# Patient Record
Sex: Female | Born: 1970 | Race: Black or African American | Hispanic: No | Marital: Married | Smoking: Never smoker
Health system: Southern US, Community
[De-identification: ages and names within clinical notes are randomized; demographics above are authoritative.]

---

## 1999-07-30 ENCOUNTER — Other Ambulatory Visit: Admission: RE | Admit: 1999-07-30 | Discharge: 1999-07-30 | Payer: Self-pay | Admitting: Obstetrics

## 2000-03-03 ENCOUNTER — Inpatient Hospital Stay (HOSPITAL_COMMUNITY): Admission: AD | Admit: 2000-03-03 | Discharge: 2000-03-06 | Payer: Self-pay

## 2006-08-22 ENCOUNTER — Ambulatory Visit (HOSPITAL_COMMUNITY): Admission: RE | Admit: 2006-08-22 | Discharge: 2006-08-22 | Payer: Self-pay | Admitting: Family Medicine

## 2006-09-08 ENCOUNTER — Ambulatory Visit (HOSPITAL_COMMUNITY): Admission: RE | Admit: 2006-09-08 | Discharge: 2006-09-08 | Payer: Self-pay | Admitting: Family Medicine

## 2006-09-12 ENCOUNTER — Ambulatory Visit (HOSPITAL_COMMUNITY): Admission: RE | Admit: 2006-09-12 | Discharge: 2006-09-12 | Payer: Self-pay | Admitting: Family Medicine

## 2010-02-13 ENCOUNTER — Ambulatory Visit (HOSPITAL_COMMUNITY)
Admission: RE | Admit: 2010-02-13 | Discharge: 2010-02-13 | Payer: Self-pay | Source: Home / Self Care | Admitting: Neurology

## 2010-04-07 ENCOUNTER — Ambulatory Visit (HOSPITAL_COMMUNITY)
Admission: RE | Admit: 2010-04-07 | Discharge: 2010-04-07 | Payer: Self-pay | Source: Home / Self Care | Attending: Family Medicine | Admitting: Family Medicine

## 2010-04-13 ENCOUNTER — Other Ambulatory Visit (HOSPITAL_COMMUNITY): Payer: Self-pay | Admitting: Family Medicine

## 2010-04-13 DIAGNOSIS — R935 Abnormal findings on diagnostic imaging of other abdominal regions, including retroperitoneum: Secondary | ICD-10-CM

## 2010-04-13 DIAGNOSIS — N83201 Unspecified ovarian cyst, right side: Secondary | ICD-10-CM

## 2010-05-19 ENCOUNTER — Ambulatory Visit (HOSPITAL_COMMUNITY): Payer: 59

## 2010-05-26 ENCOUNTER — Ambulatory Visit (HOSPITAL_COMMUNITY): Admission: RE | Admit: 2010-05-26 | Payer: 59 | Source: Ambulatory Visit

## 2010-12-07 ENCOUNTER — Other Ambulatory Visit: Payer: Self-pay | Admitting: Family Medicine

## 2010-12-07 DIAGNOSIS — Z139 Encounter for screening, unspecified: Secondary | ICD-10-CM

## 2010-12-11 ENCOUNTER — Ambulatory Visit (HOSPITAL_COMMUNITY): Payer: 59

## 2011-02-17 ENCOUNTER — Encounter: Payer: Self-pay | Admitting: Gastroenterology

## 2011-02-25 ENCOUNTER — Telehealth: Payer: Self-pay | Admitting: Gastroenterology

## 2011-02-25 ENCOUNTER — Ambulatory Visit: Payer: 59 | Admitting: Gastroenterology

## 2011-02-25 NOTE — Telephone Encounter (Signed)
Pt was a no show

## 2011-03-02 NOTE — Telephone Encounter (Signed)
Please inform PCP pt was a no-show for new appt.

## 2011-03-17 ENCOUNTER — Encounter: Payer: Self-pay | Admitting: Gastroenterology

## 2011-03-17 NOTE — Telephone Encounter (Signed)
Mailed letter to patient to call to [facility]

## 2011-06-01 ENCOUNTER — Ambulatory Visit (HOSPITAL_COMMUNITY)
Admission: RE | Admit: 2011-06-01 | Discharge: 2011-06-01 | Disposition: A | Discharge status: Home / Self Care | Payer: BC Managed Care – PPO | Source: Ambulatory Visit | Attending: Family Medicine | Admitting: Family Medicine

## 2011-06-01 DIAGNOSIS — R928 Other abnormal and inconclusive findings on diagnostic imaging of breast: Secondary | ICD-10-CM | POA: Insufficient documentation

## 2011-06-01 DIAGNOSIS — Z1231 Encounter for screening mammogram for malignant neoplasm of breast: Secondary | ICD-10-CM | POA: Insufficient documentation

## 2011-06-01 DIAGNOSIS — Z139 Encounter for screening, unspecified: Secondary | ICD-10-CM

## 2011-06-08 ENCOUNTER — Other Ambulatory Visit: Payer: Self-pay | Admitting: Family Medicine

## 2011-06-08 DIAGNOSIS — R928 Other abnormal and inconclusive findings on diagnostic imaging of breast: Secondary | ICD-10-CM

## 2011-06-14 IMAGING — US US PELVIS COMPLETE MODIFY
1 series · 13 of 25 positions shown · non-contrast
Comparison: None

CLINICAL DATA: Pelvic pain



[Series 1: us pelvis complete modify · 0.26mm/px · 13 of 79 slices shown]
[im 1/79]
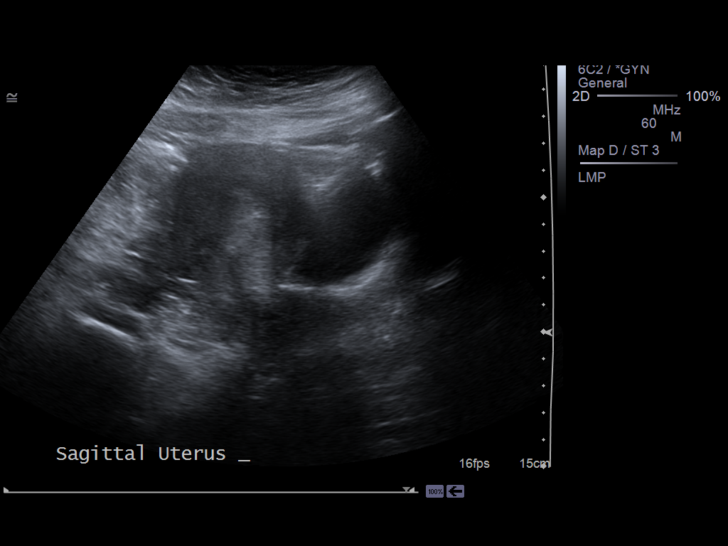
[im 7/79]
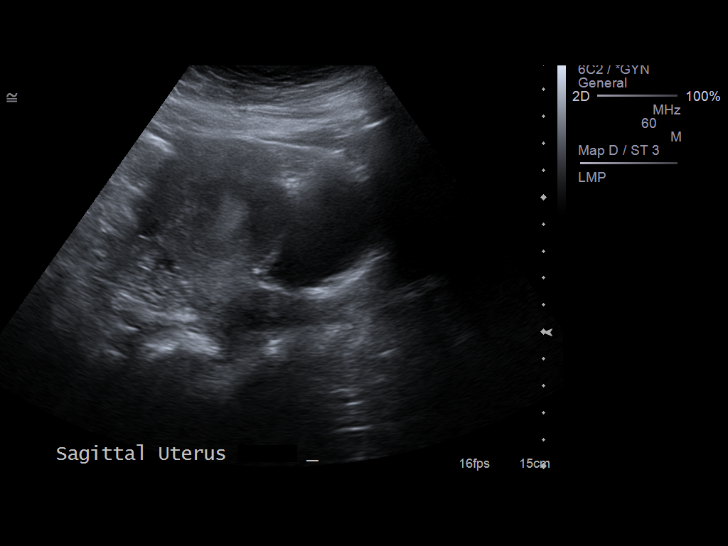
[im 14/79]
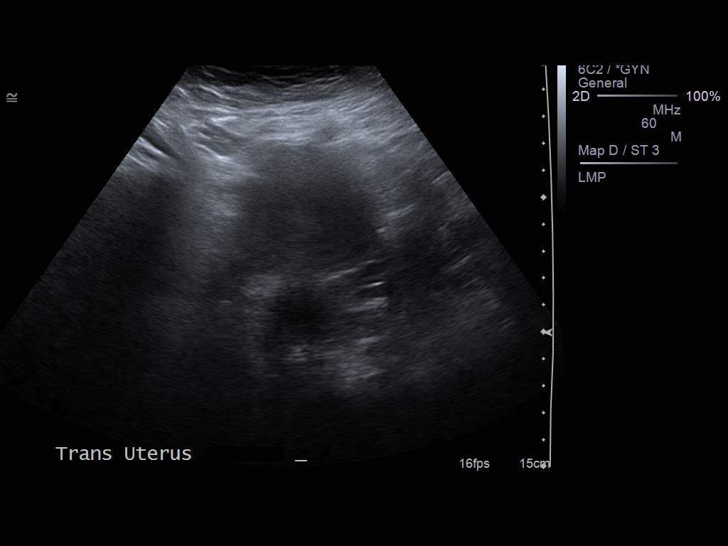
[im 20/79]
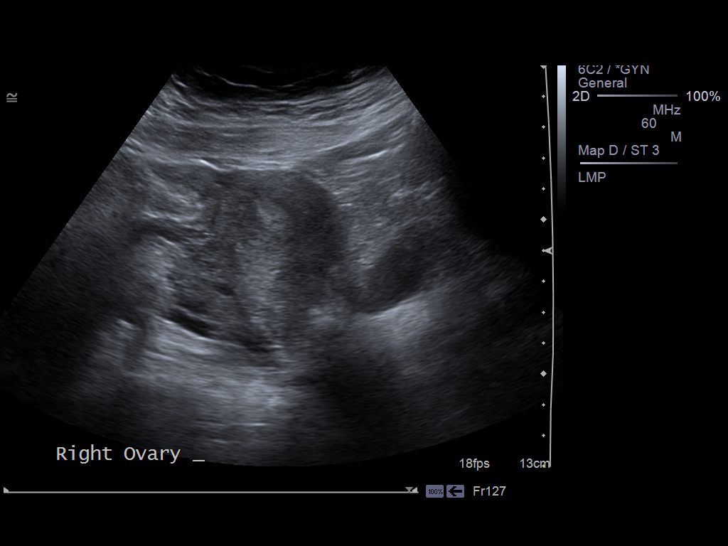
[im 27/79]
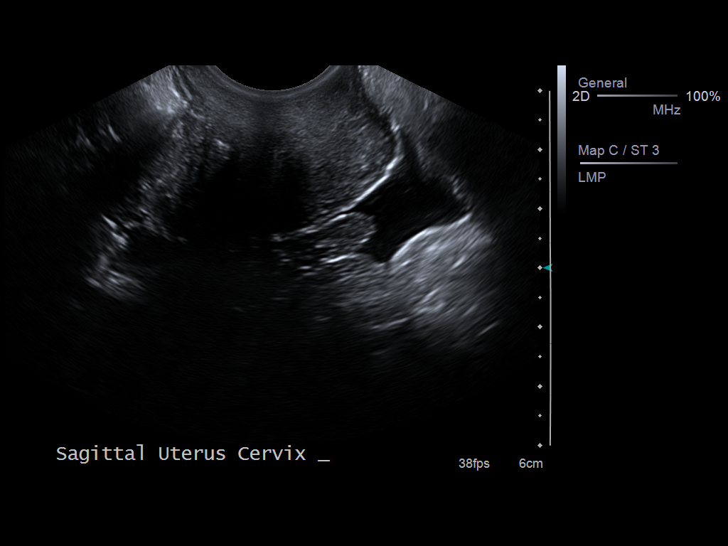
[im 33/79]
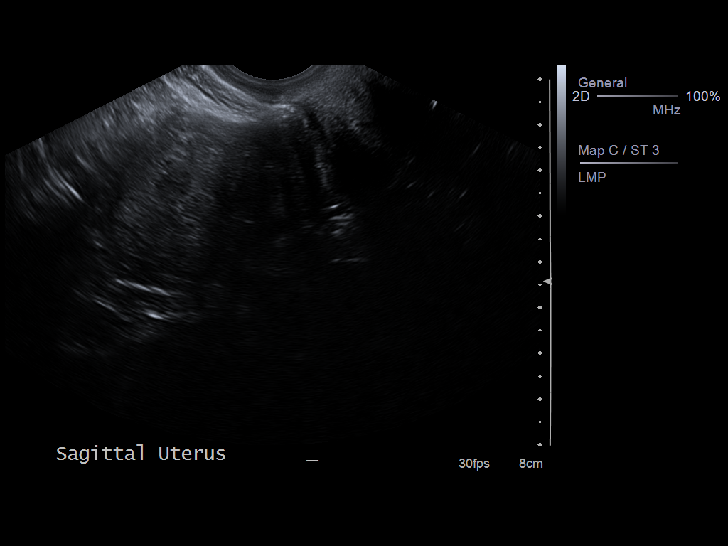
[im 40/79]
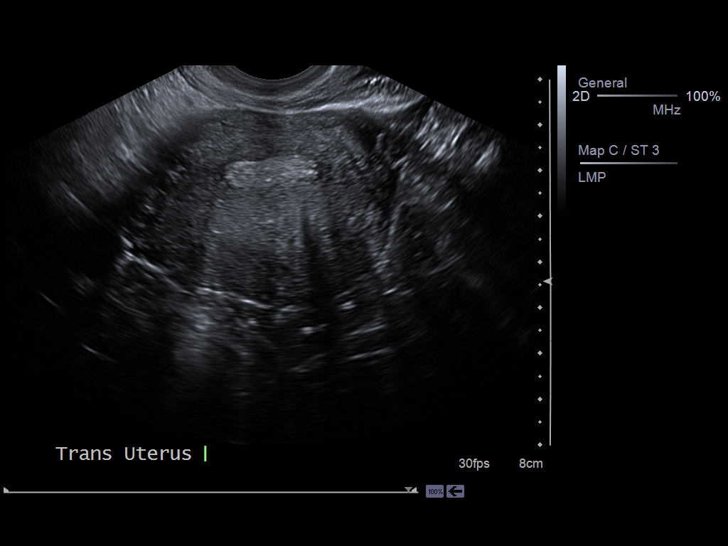
[im 46/79]
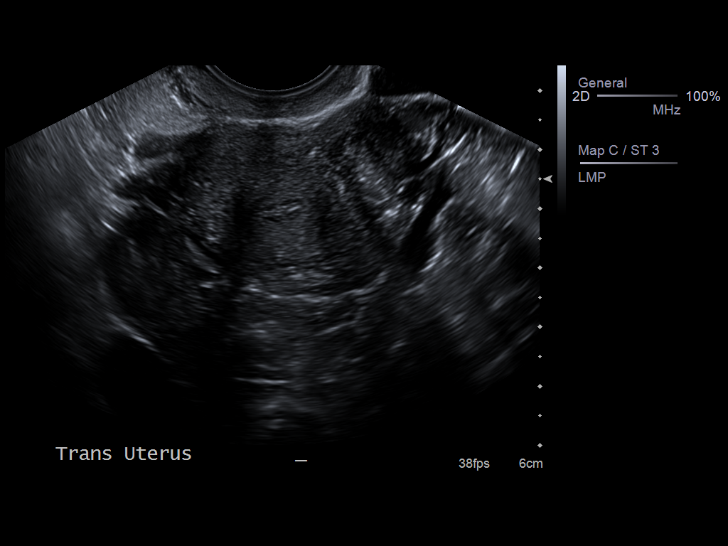
[im 53/79]
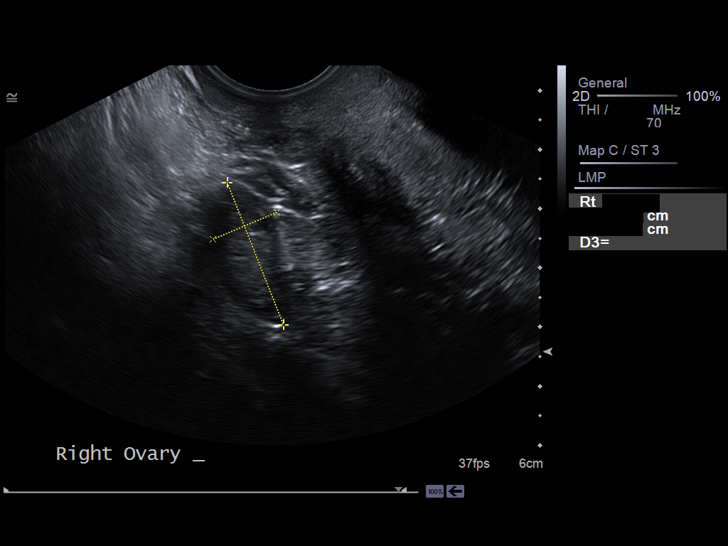
[im 59/79]
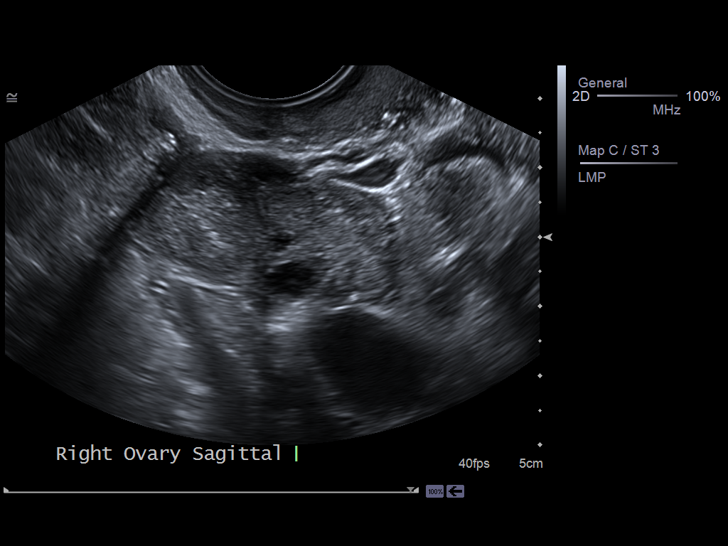
[im 66/79]
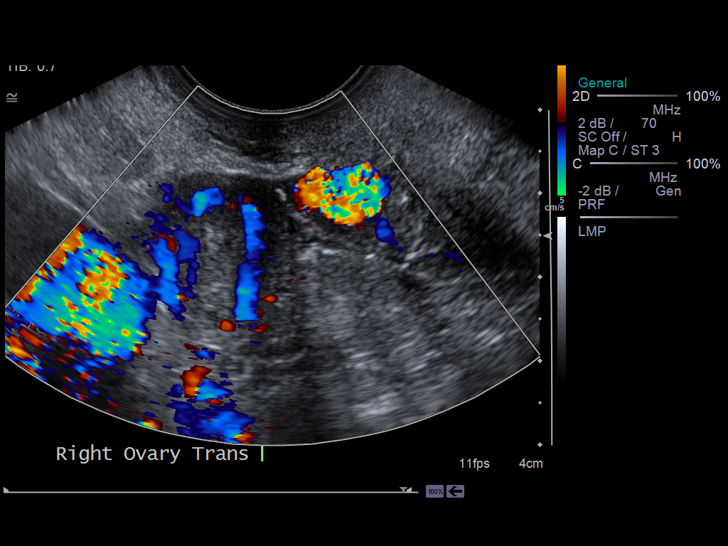
[im 72/79]
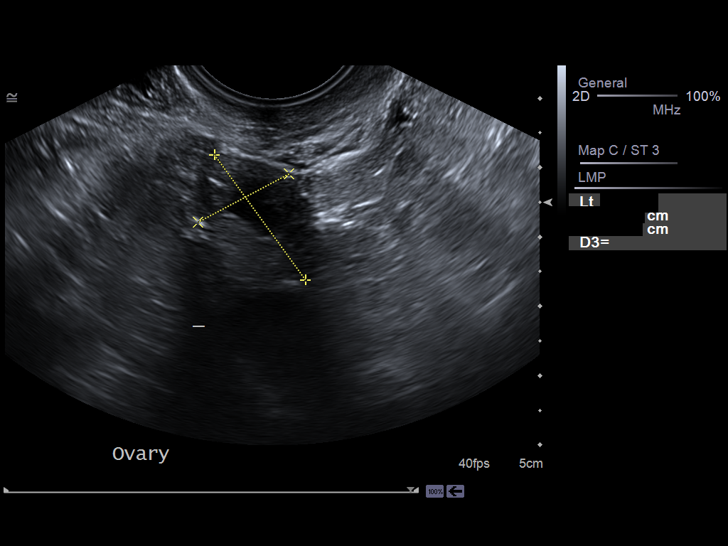
[im 79/79]
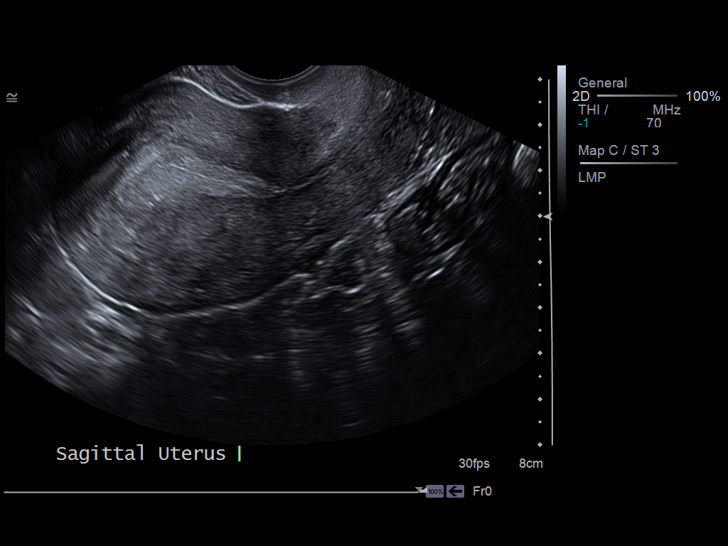

[13 of 25 positions shown; findings below may reference images not displayed]

FINDINGS: Uterus measures 8.3 cm length by 5.0 cm AP by 5.8 cm transverse.
No focal uterine mass.

Endometrium measures 8 mm thick, normal.  No endometrial fluid.

Right Ovary measures 2.6 x 1.2 x 2.6 cm.  Complex solid appearing
versus hemorrhagic nodule right ovary 2.0 x 1.5 x 1.6 cm.  No
simple cysts identified.

Left Ovary measures 2.3 x 1.5 x 1.8 cm.  Normal morphology without
mass.

Other Findings:  Small amount nonspecific free pelvic fluid.  No
additional adnexal masses.
IMPRESSION: Unremarkable uterus and left ovary.
Small nodule right ovary, question solid nodule versus hemorrhagic
cyst.
Follow-up ultrasound recommended in 1-2 months to reassess this
lesion, in order to exclude ovarian tumor.

## 2011-06-30 ENCOUNTER — Other Ambulatory Visit (HOSPITAL_COMMUNITY): Payer: Self-pay | Admitting: Family Medicine

## 2011-06-30 ENCOUNTER — Ambulatory Visit (HOSPITAL_COMMUNITY)
Admission: RE | Admit: 2011-06-30 | Discharge: 2011-06-30 | Disposition: A | Discharge status: Home / Self Care | Payer: BC Managed Care – PPO | Source: Ambulatory Visit | Attending: Family Medicine | Admitting: Family Medicine

## 2011-06-30 DIAGNOSIS — R928 Other abnormal and inconclusive findings on diagnostic imaging of breast: Secondary | ICD-10-CM

## 2011-06-30 DIAGNOSIS — N63 Unspecified lump in unspecified breast: Secondary | ICD-10-CM | POA: Insufficient documentation

## 2012-09-05 IMAGING — US US BREAST BILATERAL
1 series · 4 of 4 positions shown · non-contrast
Comparison: Baseline mammogram 06/01/2011

CLINICAL DATA: Screening callback for questioned masses in the
right lower inner quadrant and left upper outer quadrant

DIGITAL DIAGNOSTIC BILATERAL MAMMOGRAM WITHOUT CAD AND BILATERAL
BREAST ULTRASOUND:

[Series 1: us breast bilateral · 0.08mm/px · 4 of 4 slices shown]
[im 1/4]
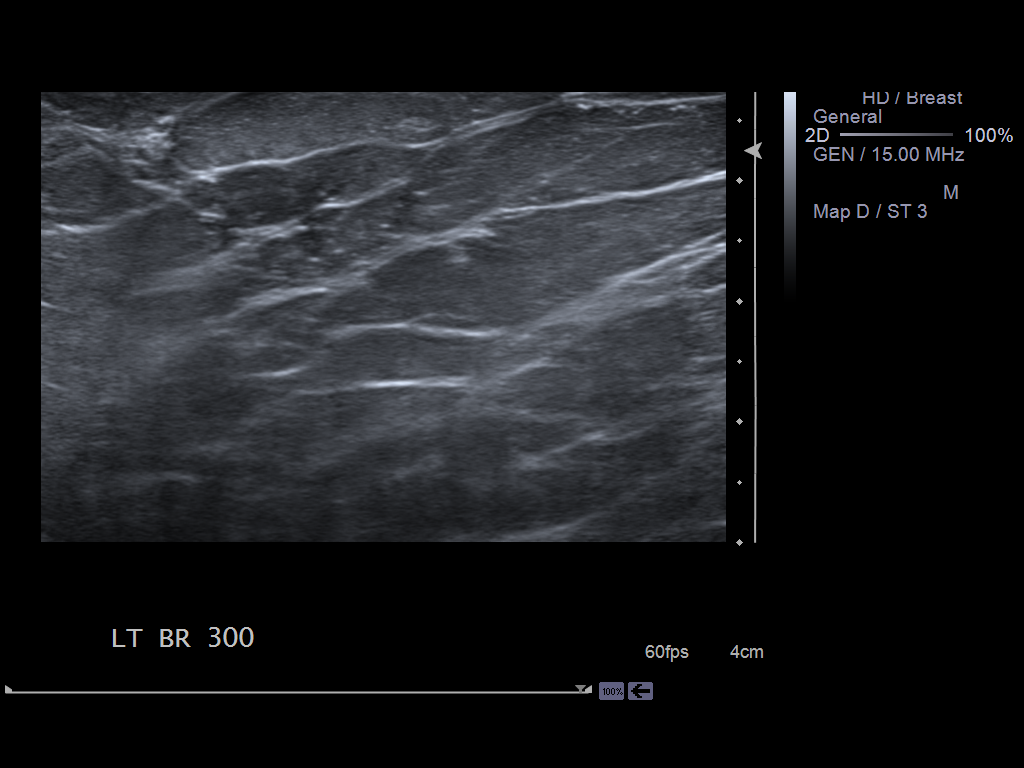
[im 2/4]
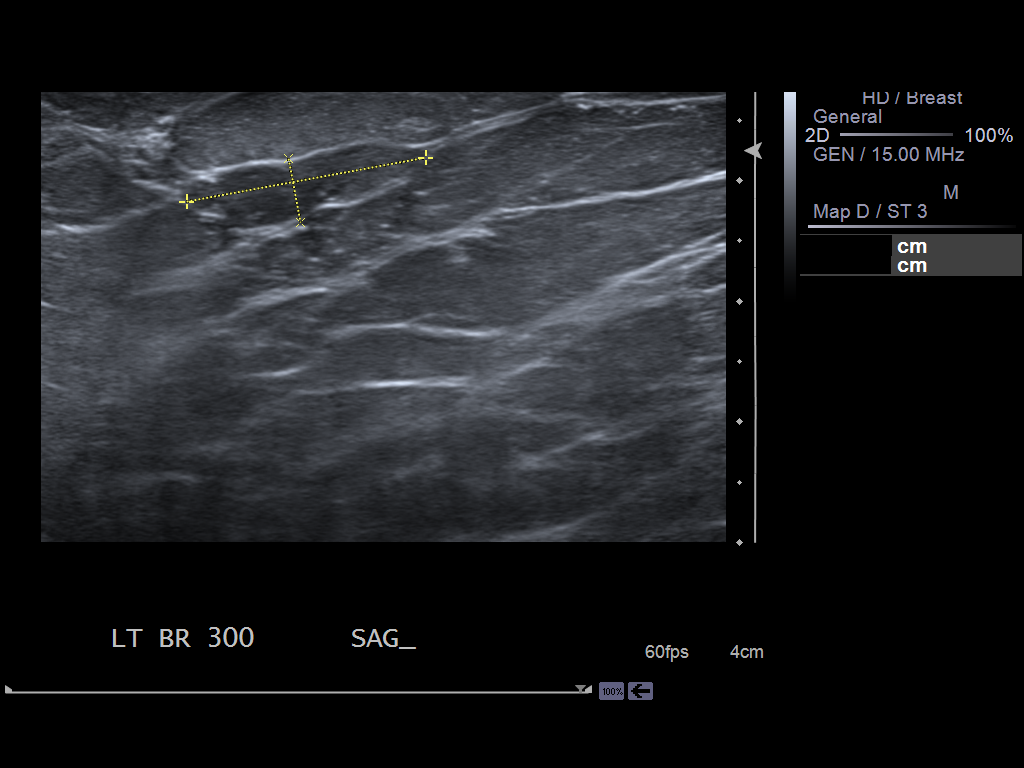
[im 3/4]
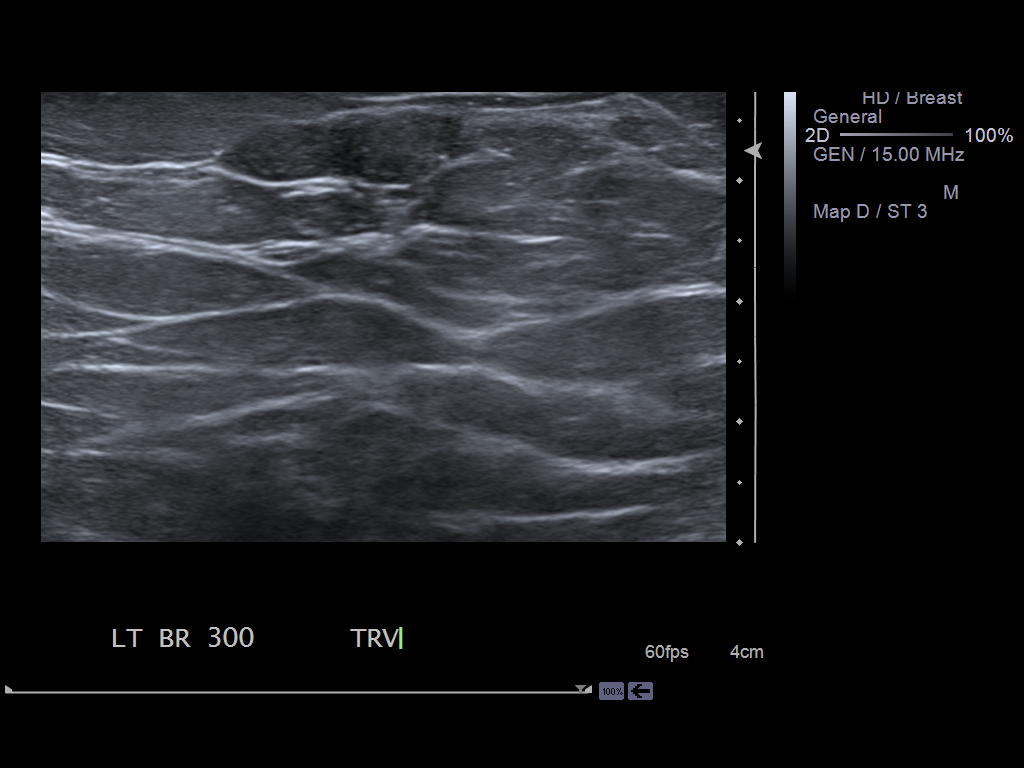
[im 4/4]
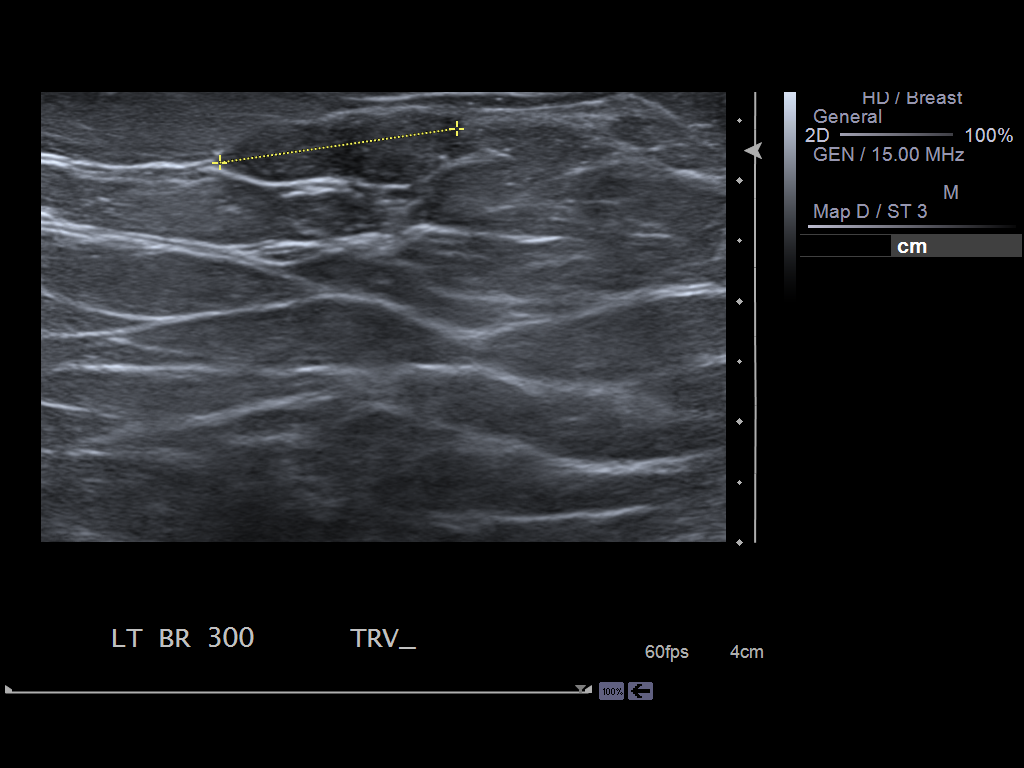

[4 of 4 positions shown; findings below may reference images not displayed]

FINDINGS: Additional views confirm the presence of oval
circumscribed sub centimeter masses in the right lower inner
quadrant and an oval circumscribed mass in the left upper outer
quadrant.  These correspond to the screening mammographic findings.

The patient states at the time of ultrasound exam that she recently
palpated a questioned palpable finding in the left upper outer
quadrant with ecchymosis, but the ecchymosis has now resolved.

On physical exam, I palpate no abnormality in the left upper outer
quadrant or right lower inner quadrant.

Ultrasound is performed, showing a hypoechoic oval circumscribed
mass in the left breast three o'clock location 10 cm from the
nipple measuring 2.0 x 2.0 x 0.5 cm.  This corresponds to the
mammographic finding.

The entire left upper outer quadrant was examined with ultrasound
and no correlate to the patient's questioned palpable finding or
area of ecchymosis is identified.

In the right lower inner quadrant, I do not identify a correlate
sonographic abnormality to the mammographic finding.
IMPRESSION: Probably benign left breast mass 3 o'clock location, better seen
mammographically than on ultrasound.  This could represent a
fibroadenoma, complicated cyst, or possibly lymph node.

Two circumscribed oval masses in the right lower inner quadrant are
likely benign based on mammography but not identified at sonography
today.

6-month follow-up bilateral diagnostic mammogram is recommended.
Findings and recommendations discussed with the patient and
provided in written form at the time of the exam.

BI-RADS CATEGORY 3:  Probably benign finding(s) - short interval
follow-up suggested.

## 2012-12-27 ENCOUNTER — Encounter: Payer: Self-pay | Admitting: Nurse Practitioner

## 2012-12-27 ENCOUNTER — Ambulatory Visit (INDEPENDENT_AMBULATORY_CARE_PROVIDER_SITE_OTHER): Payer: BC Managed Care – PPO | Admitting: Nurse Practitioner

## 2012-12-27 VITALS — BP 136/96 | HR 80 | Ht 64.75 in | Wt 299.8 lb

## 2012-12-27 DIAGNOSIS — E282 Polycystic ovarian syndrome: Secondary | ICD-10-CM

## 2012-12-27 DIAGNOSIS — Z0000 Encounter for general adult medical examination without abnormal findings: Secondary | ICD-10-CM

## 2012-12-27 DIAGNOSIS — N63 Unspecified lump in unspecified breast: Secondary | ICD-10-CM

## 2012-12-27 DIAGNOSIS — Z01419 Encounter for gynecological examination (general) (routine) without abnormal findings: Secondary | ICD-10-CM

## 2012-12-27 NOTE — Patient Instructions (Signed)
Sleeve gastrectomy [facility] Bariatric surgery

## 2013-01-01 ENCOUNTER — Encounter: Payer: Self-pay | Admitting: Nurse Practitioner

## 2013-01-03 ENCOUNTER — Ambulatory Visit (HOSPITAL_COMMUNITY)
Admission: RE | Admit: 2013-01-03 | Discharge: 2013-01-03 | Disposition: A | Discharge status: Home / Self Care | Payer: BC Managed Care – PPO | Source: Ambulatory Visit | Attending: Nurse Practitioner | Admitting: Nurse Practitioner

## 2013-01-03 DIAGNOSIS — N63 Unspecified lump in unspecified breast: Secondary | ICD-10-CM

## 2013-01-03 DIAGNOSIS — E282 Polycystic ovarian syndrome: Secondary | ICD-10-CM | POA: Insufficient documentation

## 2013-01-03 DIAGNOSIS — Z09 Encounter for follow-up examination after completed treatment for conditions other than malignant neoplasm: Secondary | ICD-10-CM | POA: Insufficient documentation

## 2013-01-03 NOTE — Progress Notes (Signed)
  Subjective:    Patient ID: Angel Gomez, female    DOB: [birth year], 42 y.o.   MRN: [id number]  HPI presents for wellness checkup. Regular cycles, normal flow. No new partners. No vaginal discharge. No pelvic pain. No fever. Regular vision and dental exams. Is overdue for a bilateral diagnostic mammogram see previous notes. Reflux stable.    Review of Systems  Constitutional: Negative for activity change, appetite change and fatigue.  HENT: Negative for dental problem, ear pain, hearing loss, rhinorrhea, sinus pressure and sore throat.   Eyes: Negative for visual disturbance.  Respiratory: Negative for cough, chest tightness, shortness of breath and wheezing.   Cardiovascular: Negative for chest pain and leg swelling.  Gastrointestinal: Negative for nausea, vomiting, abdominal pain, diarrhea, constipation and blood in stool.  Genitourinary: Negative for dysuria, urgency, frequency, vaginal discharge, enuresis, difficulty urinating, menstrual problem and pelvic pain.  Neurological: Negative for headaches.  Psychiatric/Behavioral: Negative for sleep disturbance and dysphoric mood. The patient is not nervous/anxious.        Objective:   Physical Exam  Vitals reviewed. Constitutional: She is oriented to person, place, and time. She appears well-developed. No distress.  HENT:  Right Ear: External ear normal.  Left Ear: External ear normal.  Mouth/Throat: Oropharynx is clear and moist.  Neck: Normal range of motion. Neck supple. No tracheal deviation present. No thyromegaly present.  Cardiovascular: Normal rate, regular rhythm and normal heart sounds.  Exam reveals no gallop.   No murmur heard. Pulmonary/Chest: Effort normal and breath sounds normal.  Abdominal: Soft. She exhibits no distension. There is no tenderness.  Genitourinary: Vagina normal and uterus normal. No vaginal discharge found.  Musculoskeletal: She exhibits no edema.  Lymphadenopathy:    She has no cervical  adenopathy.  Neurological: She is alert and oriented to person, place, and time.  Skin: Skin is warm and dry. No rash noted.  Psychiatric: She has a normal mood and affect. Her behavior is normal.   Breast exam: Very large breasts, no obvious masses. Axilla no adenopathy. External GU normal. Vagina no discharge. Bimanual exam normal, limited due to abdominal girth. Excessive hair growth noted on the chin and upper mid chest wall.       Assessment & Plan:  Well woman exam  Breast mass - Plan: MM Digital Diagnostic Bilat  Routine general medical examination at a health care facility - Plan: Hepatic function panel, Lipid panel, TSH, Vit D  25 hydroxy (rtn osteoporosis monitoring), Basic metabolic panel  breast mass per mammo Morbid obesity PCOS  Discussed at length the importance of weight loss including options such as weight loss surgery. Recommend daily vitamin D and calcium supplementation. Increase activity. Healthy diet. Next physical in one year.

## 2013-01-03 NOTE — Assessment & Plan Note (Signed)
Patient is overdue for diagnostic bilateral mammogram, this was scheduled during her physical.

## 2013-10-24 ENCOUNTER — Other Ambulatory Visit: Payer: Self-pay | Admitting: Family Medicine

## 2014-01-21 ENCOUNTER — Encounter: Payer: BC Managed Care – PPO | Admitting: Family Medicine

## 2014-01-24 ENCOUNTER — Encounter: Payer: BC Managed Care – PPO | Admitting: Family Medicine

## 2014-02-04 ENCOUNTER — Ambulatory Visit (INDEPENDENT_AMBULATORY_CARE_PROVIDER_SITE_OTHER): Payer: BC Managed Care – PPO | Admitting: Family Medicine

## 2014-02-04 ENCOUNTER — Encounter: Payer: Self-pay | Admitting: Family Medicine

## 2014-02-04 VITALS — BP 112/80 | Ht 65.5 in | Wt 296.1 lb

## 2014-02-04 DIAGNOSIS — Z0000 Encounter for general adult medical examination without abnormal findings: Secondary | ICD-10-CM

## 2014-02-04 NOTE — Progress Notes (Signed)
   Subjective:    Patient ID: Angel Gomez, female    DOB: Apr 23, 1970, 43 y.o.   MRN: [id number]  HPI The patient comes in today for a wellness visit.    A review of their health history was completed.  A review of medications was also completed.  Any needed refills; none  Eating habits: good  Falls/  MVA accidents in past few months: none  Regular exercise: Walking 3 times a week   Specialist pt sees on regular basis: none  Preventative health issues were discussed.   Additional concerns: none  No further paraesthesia  duew mmmo soon  Reflux is now much better  Still having rare migt ha didfficulties  Walking regularly  Exercising three dys per wk ona ve  Patient states her reflux is stable on medication.  Review of Systems  Constitutional: Negative for activity change, appetite change and fatigue.       Progressive weight gain  HENT: Negative for congestion, ear discharge and rhinorrhea.   Eyes: Negative for discharge.  Respiratory: Negative for cough, chest tightness and wheezing.   Cardiovascular: Negative for chest pain.  Gastrointestinal: Negative for vomiting and abdominal pain.       Intermittent reflux  Genitourinary: Negative for frequency and difficulty urinating.  Musculoskeletal: Negative for neck pain.  Allergic/Immunologic: Negative for environmental allergies and food allergies.  Neurological: Negative for weakness and headaches.  Psychiatric/Behavioral: Negative for behavioral problems and agitation.  All other systems reviewed and are negative.      Objective:   Physical Exam  Constitutional: She is oriented to person, place, and time. She appears well-developed and well-nourished.  Morbid obesity present  HENT:  Head: Normocephalic.  Right Ear: External ear normal.  Left Ear: External ear normal.  Eyes: Pupils are equal, round, and reactive to light.  Neck: Normal range of motion. No thyromegaly present.  Cardiovascular: Normal  rate, regular rhythm, normal heart sounds and intact distal pulses.   No murmur heard. Pulmonary/Chest: Effort normal and breath sounds normal. No respiratory distress. She has no wheezes.  Abdominal: Soft. Bowel sounds are normal. She exhibits no distension and no mass. There is no tenderness.  Musculoskeletal: Normal range of motion. She exhibits no edema or tenderness.  Lymphadenopathy:    She has no cervical adenopathy.  Neurological: She is alert and oriented to person, place, and time. She exhibits normal muscle tone.  Skin: Skin is warm and dry.  Psychiatric: She has a normal mood and affect. Her behavior is normal.  Vitals reviewed.         Assessment & Plan:  Impression wellness exam #2 morbid obesity discussed at length #3 reflux stable plan diet discussed exercise discussed. Encouraged to consider bariatric intervention. Patient to think about this. WSL

## 2014-02-06 LAB — PAP IG W/ RFLX HPV ASCU

## 2014-12-16 ENCOUNTER — Ambulatory Visit (INDEPENDENT_AMBULATORY_CARE_PROVIDER_SITE_OTHER): Payer: BLUE CROSS/BLUE SHIELD | Admitting: Family Medicine

## 2014-12-16 ENCOUNTER — Encounter: Payer: Self-pay | Admitting: Family Medicine

## 2014-12-16 VITALS — BP 122/82 | Ht 65.5 in | Wt 284.4 lb

## 2014-12-16 DIAGNOSIS — K219 Gastro-esophageal reflux disease without esophagitis: Secondary | ICD-10-CM

## 2014-12-16 DIAGNOSIS — R002 Palpitations: Secondary | ICD-10-CM

## 2014-12-16 DIAGNOSIS — H8110 Benign paroxysmal vertigo, unspecified ear: Secondary | ICD-10-CM | POA: Diagnosis not present

## 2014-12-16 MED ORDER — MECLIZINE HCL 25 MG PO TABS: 25.0000 mg | ORAL_TABLET | Freq: Three times a day (TID) | ORAL | Status: DC | PRN

## 2014-12-16 MED ORDER — PANTOPRAZOLE SODIUM 40 MG PO TBEC: 40.0000 mg | DELAYED_RELEASE_TABLET | Freq: Every day | ORAL | Status: DC

## 2014-12-16 NOTE — Patient Instructions (Signed)
Benign Positional Vertigo Vertigo means you feel like you or your surroundings are moving when they are not. Benign positional vertigo is the most common form of vertigo. Benign means that the cause of your condition is not serious. Benign positional vertigo is more common in older adults. CAUSES  Benign positional vertigo is the result of an upset in the labyrinth system. This is an area in the middle ear that helps control your balance. This may be caused by a viral infection, head injury, or repetitive motion. However, often no specific cause is found. SYMPTOMS  Symptoms of benign positional vertigo occur when you move your head or eyes in different directions. Some of the symptoms may include:  Loss of balance and falls.  Vomiting.  Blurred vision.  Dizziness.  Nausea.  Involuntary eye movements (nystagmus). DIAGNOSIS  Benign positional vertigo is usually diagnosed by physical exam. If the specific cause of your benign positional vertigo is unknown, your caregiver may perform imaging tests, such as magnetic resonance imaging (MRI) or computed tomography (CT). TREATMENT  Your caregiver may recommend movements or procedures to correct the benign positional vertigo. Medicines such as meclizine, benzodiazepines, and medicines for nausea may be used to treat your symptoms. In rare cases, if your symptoms are caused by certain conditions that affect the inner ear, you may need surgery. HOME CARE INSTRUCTIONS   Follow your caregiver's instructions.  Move slowly. Do not make sudden body or head movements.  Avoid driving.  Avoid operating heavy machinery.  Avoid performing any tasks that would be dangerous to you or others during a vertigo episode.  Drink enough fluids to keep your urine clear or pale yellow. SEEK IMMEDIATE MEDICAL CARE IF:   You develop problems with walking, weakness, numbness, or using your arms, hands, or legs.  You have difficulty speaking.  You develop  severe headaches.  Your nausea or vomiting continues or gets worse.  You develop visual changes.  Your family or friends notice any behavioral changes.  Your condition gets worse.  You have a fever.  You develop a stiff neck or sensitivity to light. MAKE SURE YOU:   Understand these instructions.  Will watch your condition.  Will get help right away if you are not doing well or get worse. Document Released: 12/07/2005 Document Revised: 05/24/2011 Document Reviewed: 11/19/2010 ExitCare Patient Information 2015 [employer]. This information is not intended to replace advice given to you by your health care provider. Make sure you discuss any questions you have with your health care provider.    

## 2014-12-16 NOTE — Progress Notes (Signed)
   Subjective:    Patient ID: Angel Gomez, female    DOB: 1970/10/04, 44 y.o.   MRN: [id number]  HPI  Patient states she woke up really nauseated and went to bathroom and the room was swimming like vertigo and she broke out in a sweat. Patient states she ate something and it helped ease the sx. She states she will broke this morning when she got up when she rolled her head over the room started spinning she felt nauseous she broke a sweat she felt fatigued and tired then she had one more spell of dizziness which was brief then it went away she is feeling better now  She also relates intermittent palpitations that have occurred a couple different times in the past nothing sustained no chest pressure tightness or shortness of breath  Patient does have reflux needed refill on medicine uses it 2 or 3 times per week keep symptoms under control Review of Systems  Constitutional: Negative for fever.  HENT: Negative for congestion.   Gastrointestinal: Positive for nausea. Negative for vomiting and diarrhea.  Neurological: Positive for dizziness and light-headedness.       Objective:   Physical Exam  Constitutional: She appears well-nourished. No distress.  Cardiovascular: Normal rate, regular rhythm and normal heart sounds.   No murmur heard. Pulmonary/Chest: Effort normal and breath sounds normal. No respiratory distress.  Musculoskeletal: She exhibits no edema.  Lymphadenopathy:    She has no cervical adenopathy.  Neurological: She is alert. She exhibits normal muscle tone.  Psychiatric: Her behavior is normal.  Vitals reviewed.  Finger to nose is normal Romberg normal strength bilateral normal finger to nose normal patient can walk a line without falling left or right. EOMI no nystagmus. Heart without murmur      Assessment & Plan:  I believe patient having some palpitations I don't recommend telemetry I did explain to the patient if she starts having frequent palpitations or  sustained tachycardia she would need further intervention certainly if emergent go to ER otherwise notify us we would help set up for telemetry  Patient with in her ear dysfunction I would recommend meclizine when necessary in addition to this should gradually get better but if worse may need referral to ENT

## 2015-03-06 ENCOUNTER — Ambulatory Visit (INDEPENDENT_AMBULATORY_CARE_PROVIDER_SITE_OTHER): Payer: BLUE CROSS/BLUE SHIELD | Admitting: Family Medicine

## 2015-03-06 ENCOUNTER — Encounter: Payer: Self-pay | Admitting: Family Medicine

## 2015-03-06 VITALS — BP 132/80 | Ht 65.0 in | Wt 286.4 lb

## 2015-03-06 DIAGNOSIS — Z0000 Encounter for general adult medical examination without abnormal findings: Secondary | ICD-10-CM | POA: Diagnosis not present

## 2015-03-06 NOTE — Progress Notes (Signed)
   Subjective:    Patient ID: Angel Gomez, female    DOB: 23-Dec-1970, 44 y.o.   MRN: [id number]  HPI The patient comes in today for a wellness visit.   Colon ca no fam hx as far a pt knows  A review of their health history was completed.  A review of medications was also completed.  Any needed refills; Yes  Eating habits: Patient states eating habits are fair. Tries to control portions. Tries to incorporate lots of vegatables, and fruit  Falls/  MVA accidents in past few months: None  Regular exercise: Yes, Patient states walks 3 days a week at 30 minutes intervals.  Specialist pt sees on regular basis: None  Preventative health issues were discussed.   Additional concerns: Patient states no other concerns this visit.  [facility] h dstart, three yr old an d [age] yr olds    Reflux and heart burn stable on meds, nothing extreme . Uses the protonix   Watching portion control, nore fruits and veggies a lot of water  Exercise is at least three d minimum, good  Review of Systems  Constitutional: Negative for activity change, appetite change and fatigue.  HENT: Negative for congestion, ear discharge and rhinorrhea.   Eyes: Negative for discharge.  Respiratory: Negative for cough, chest tightness and wheezing.   Cardiovascular: Negative for chest pain.  Gastrointestinal: Negative for vomiting and abdominal pain.  Genitourinary: Negative for frequency and difficulty urinating.  Musculoskeletal: Negative for neck pain.  Allergic/Immunologic: Negative for environmental allergies and food allergies.  Neurological: Negative for weakness and headaches.  Psychiatric/Behavioral: Negative for behavioral problems and agitation.  All other systems reviewed and are negative.      Objective:   Physical Exam  Constitutional: She is oriented to person, place, and time. She appears well-developed and well-nourished.  Morbid obesity persists  HENT:  Head: Normocephalic.  Right Ear:  External ear normal.  Left Ear: External ear normal.  Eyes: Pupils are equal, round, and reactive to light.  Neck: Normal range of motion. No thyromegaly present.  Cardiovascular: Normal rate, regular rhythm, normal heart sounds and intact distal pulses.   No murmur heard. Pulmonary/Chest: Effort normal and breath sounds normal. No respiratory distress. She has no wheezes.  Abdominal: Soft. Bowel sounds are normal. She exhibits no distension and no mass. There is no tenderness.  Musculoskeletal: Normal range of motion. She exhibits no edema or tenderness.  Lymphadenopathy:    She has no cervical adenopathy.  Neurological: She is alert and oriented to person, place, and time. She exhibits normal muscle tone.  Skin: Skin is warm and dry.  Psychiatric: She has a normal mood and affect. Her behavior is normal.  Vitals reviewed.         Assessment & Plan:  Impression 1 well daughter exam #2 reflux ongoing control of meds #3 morbid obesity discussed at length including potential bariatric surgery interventions patient wishes to continue on conservative path plan blood work reviewed. Diet exercise discussed. Patient to call and schedule mammogram. WSL

## 2015-03-27 ENCOUNTER — Telehealth: Payer: Self-pay | Admitting: Family Medicine

## 2015-03-27 DIAGNOSIS — N63 Unspecified lump in unspecified breast: Secondary | ICD-10-CM

## 2015-03-27 NOTE — Telephone Encounter (Signed)
[facility] they told her they  Changed their policy an now we have to schedule it for our patients

## 2015-03-27 NOTE — Telephone Encounter (Signed)
May we put in an order for Mammogram?

## 2015-03-28 NOTE — Telephone Encounter (Signed)
[facility] (Need to know when patient can go have mammo done before we schedule). Patient needed a diagnostic mammo and ultrasound of both breast per radiology and that has to be put in by the doctor. Orders are in the system. FYI- Patients can still call and schedule their own screening mammograms.

## 2015-03-28 NOTE — Telephone Encounter (Signed)
That's curious? Find out about this? Pts cn generally schedule their own? May nbeed ot spk with radiology in addtn to pt

## 2015-03-28 NOTE — Telephone Encounter (Signed)
Called patient and informed her appointment for Mammogram on January 24th at 3:20 pm. Patient verbalized understanding.

## 2015-03-31 ENCOUNTER — Other Ambulatory Visit: Payer: Self-pay | Admitting: Family Medicine

## 2015-03-31 ENCOUNTER — Other Ambulatory Visit: Payer: Self-pay | Admitting: *Deleted

## 2015-03-31 DIAGNOSIS — N63 Unspecified lump in unspecified breast: Secondary | ICD-10-CM

## 2015-04-02 ENCOUNTER — Other Ambulatory Visit: Payer: Self-pay | Admitting: Family Medicine

## 2015-04-08 ENCOUNTER — Encounter (HOSPITAL_COMMUNITY): Payer: Self-pay

## 2015-04-08 ENCOUNTER — Ambulatory Visit (HOSPITAL_COMMUNITY)
Admission: RE | Admit: 2015-04-08 | Discharge: 2015-04-08 | Disposition: A | Discharge status: Home / Self Care | Payer: BLUE CROSS/BLUE SHIELD | Source: Ambulatory Visit | Attending: Family Medicine | Admitting: Family Medicine

## 2015-04-08 DIAGNOSIS — N63 Unspecified lump in breast: Secondary | ICD-10-CM | POA: Diagnosis not present

## 2015-04-22 ENCOUNTER — Encounter: Payer: Self-pay | Admitting: Family Medicine

## 2015-04-22 ENCOUNTER — Telehealth: Payer: Self-pay | Admitting: Family Medicine

## 2015-04-22 ENCOUNTER — Ambulatory Visit (INDEPENDENT_AMBULATORY_CARE_PROVIDER_SITE_OTHER): Payer: BLUE CROSS/BLUE SHIELD | Admitting: Family Medicine

## 2015-04-22 VITALS — BP 126/84 | Temp 98.7°F | Ht 65.0 in | Wt 288.0 lb

## 2015-04-22 DIAGNOSIS — J019 Acute sinusitis, unspecified: Secondary | ICD-10-CM

## 2015-04-22 DIAGNOSIS — B9689 Other specified bacterial agents as the cause of diseases classified elsewhere: Secondary | ICD-10-CM

## 2015-04-22 MED ORDER — AMOXICILLIN-POT CLAVULANATE 875-125 MG PO TABS: 1.0000 | ORAL_TABLET | Freq: Two times a day (BID) | ORAL | Status: DC

## 2015-04-22 NOTE — Telephone Encounter (Signed)
Pt had PE in December, needs form for work filled out please   Call pt when ready for pick up   In yellow forms folder

## 2015-04-22 NOTE — Progress Notes (Signed)
   Subjective:    Patient ID: Angel Gomez, female    DOB: 1970-12-04, 45 y.o.   MRN: [id number]  Sinusitis This is a new problem. The current episode started in the past 7 days. The problem is unchanged. There has been no fever. Associated symptoms include ear pain, headaches, sinus pressure and a sore throat. Treatments tried: otc meds. The treatment provided no relief.    Patient relates head congestion drainage coughing sinus pressure occurred after having a mild cold PMH no negative for frequent sinus infections Review of Systems  HENT: Positive for ear pain, sinus pressure and sore throat.   Neurological: Positive for headaches.   PMH benign    Objective:   Physical Exam   Mild sinus tenderness eardrums normal throat normal neck supple lungs clear     Assessment & Plan:  Viral illness Secondary rhinosinusitis Patient was seen today for upper respiratory illness. It is felt that the patient is dealing with sinusitis. Antibiotics were prescribed today. Importance of compliance with medication was discussed. Symptoms should gradually resolve over the course of the next several days. If high fevers, progressive illness, difficulty breathing, worsening condition or failure for symptoms to improve over the next several days then the patient is to follow-up. If any emergent conditions the patient is to follow-up in the emergency department otherwise to follow-up in the office.

## 2015-04-23 NOTE — Telephone Encounter (Signed)
Left VM to come get form when she can

## 2016-05-28 ENCOUNTER — Telehealth: Payer: Self-pay | Admitting: Family Medicine

## 2016-05-28 ENCOUNTER — Other Ambulatory Visit: Payer: Self-pay | Admitting: Nurse Practitioner

## 2016-05-28 MED ORDER — FLUCONAZOLE 150 MG PO TABS: ORAL_TABLET | ORAL | 0 refills | Status: DC

## 2016-05-28 NOTE — Telephone Encounter (Signed)
Patient notified

## 2016-05-28 NOTE — Telephone Encounter (Signed)
Sent in. Call back if persists.  

## 2016-05-28 NOTE — Telephone Encounter (Signed)
Pt is needing something called in for a yeast infection. Pt is having itching and burning with no discharge.     [facility]

## 2016-05-31 ENCOUNTER — Other Ambulatory Visit: Payer: Self-pay | Admitting: Nurse Practitioner

## 2016-06-07 ENCOUNTER — Other Ambulatory Visit: Payer: Self-pay | Admitting: Family Medicine

## 2016-06-21 ENCOUNTER — Other Ambulatory Visit: Payer: Self-pay | Admitting: Family Medicine

## 2016-08-02 ENCOUNTER — Telehealth: Payer: Self-pay | Admitting: *Deleted

## 2016-08-02 NOTE — Telephone Encounter (Signed)
Patient advised Dr Brett CanalesSteve recommends an office visit to discuss recent lab work thru work. Patient scheduled office visit to discuss.

## 2016-08-02 NOTE — Telephone Encounter (Signed)
Left message to return call. See note from dr Brett Canalessteve in nurse's folder. Dr Brett Canalessteve wants pt to come in for office visit to discuss bw abnormality.

## 2016-08-10 ENCOUNTER — Ambulatory Visit: Payer: BLUE CROSS/BLUE SHIELD | Admitting: Family Medicine

## 2016-11-25 ENCOUNTER — Other Ambulatory Visit: Payer: Self-pay | Admitting: Family Medicine

## 2016-11-26 ENCOUNTER — Other Ambulatory Visit: Payer: Self-pay | Admitting: *Deleted

## 2016-11-26 ENCOUNTER — Telehealth: Payer: Self-pay | Admitting: Family Medicine

## 2016-11-26 MED ORDER — PANTOPRAZOLE SODIUM 40 MG PO TBEC: 40.0000 mg | DELAYED_RELEASE_TABLET | Freq: Every day | ORAL | 0 refills | Status: DC

## 2016-11-26 NOTE — Telephone Encounter (Signed)
Requesting Rx for Pantoprazole to [facility].

## 2016-11-26 NOTE — Telephone Encounter (Signed)
Med sent to pharm. Left message to return call to let pt know she needs office visit

## 2016-11-26 NOTE — Telephone Encounter (Signed)
rx 30 with note none further until seen again

## 2016-11-26 NOTE — Telephone Encounter (Signed)
Last seen for a medication check 03/06/2015. No appointments scheduled. May we refill?

## 2017-01-01 ENCOUNTER — Other Ambulatory Visit: Payer: Self-pay | Admitting: Family Medicine

## 2017-01-04 NOTE — Telephone Encounter (Signed)
30 d needs appt 

## 2017-01-04 NOTE — Telephone Encounter (Signed)
Last seen 03/06/15 for wellness

## 2019-02-15 ENCOUNTER — Ambulatory Visit (INDEPENDENT_AMBULATORY_CARE_PROVIDER_SITE_OTHER): Payer: Self-pay | Admitting: Family Medicine

## 2019-02-15 ENCOUNTER — Other Ambulatory Visit: Payer: Self-pay

## 2019-02-15 DIAGNOSIS — K219 Gastro-esophageal reflux disease without esophagitis: Secondary | ICD-10-CM

## 2019-02-15 MED ORDER — PANTOPRAZOLE SODIUM 40 MG PO TBEC: 40.0000 mg | DELAYED_RELEASE_TABLET | Freq: Every day | ORAL | 11 refills | Status: AC

## 2019-02-15 NOTE — Addendum Note (Signed)
Addended by: Dairl Ponder on: 02/15/2019 03:00 PM   Modules accepted: Orders

## 2019-02-15 NOTE — Progress Notes (Signed)
   Subjective:  Audio only  Patient ID: Angel Gomez, female    DOB: [birth year], 48 y.o.   MRN: [id number]  HPI  Patient calls for a follow up on reflux. Patient states she is over all doing well with flares from time to time. Needs refills on Pantoprazole.  Virtual Visit via Video Note  I connected with Angel Gomez on 02/15/19 at  2:00 PM EST by a video enabled telemedicine application and verified that I am speaking with the correct person using two identifiers.  Location: Patient: home Provider: office   I discussed the limitations of evaluation and management by telemedicine and the availability of in person appointments. The patient expressed understanding and agreed to proceed.  History of Present Illness:    Observations/Objective:   Assessment and Plan:   Follow Up Instructions:    I discussed the assessment and treatment plan with the patient. The patient was provided an opportunity to ask questions and all were answered. The patient agreed with the plan and demonstrated an understanding of the instructions.   The patient was advised to call back or seek an in-person evaluation if the symptoms worsen or if the condition fails to improve as anticipated.  I provided 17 minutes of non-face-to-face time during this encounter.   Patient gives fairly significant history of reflux down through the years.  Uses Protonix to much benefit.  Tries to use it sparingly and only on occasion when needed.  Now without definitely needs it.  Flared by certain foods spicy foods etc.  Tries to limit caffeine intake.  Has lost 25 pounds over the last couple years   Review of Systems No headache no chest pain no shortness of breath    Objective:   Physical Exam  Virtual      Assessment & Plan:  Impression flare of reflux plan Protonix.  Proper use discussed.  Long-term implications of daily use discussed symptom care discussed

## 2019-04-18 ENCOUNTER — Encounter: Payer: Self-pay | Admitting: Family Medicine
